# Patient Record
Sex: Female | Born: 1945 | Race: White | Hispanic: No | Marital: Married | Smoking: Never smoker
Health system: Southern US, Community
[De-identification: ages and names within clinical notes are randomized; demographics above are authoritative.]

## PROBLEM LIST (undated history)

## (undated) DIAGNOSIS — E079 Disorder of thyroid, unspecified: Secondary | ICD-10-CM

## (undated) HISTORY — PX: DILATION AND CURETTAGE OF UTERUS: SHX78

## (undated) HISTORY — PX: COLONOSCOPY: SHX174

---

## 2006-07-17 ENCOUNTER — Emergency Department (HOSPITAL_COMMUNITY): Admission: EM | Admit: 2006-07-17 | Discharge: 2006-07-17 | Payer: Self-pay | Admitting: Emergency Medicine

## 2021-08-02 ENCOUNTER — Other Ambulatory Visit: Payer: Self-pay

## 2021-08-02 ENCOUNTER — Emergency Department (HOSPITAL_COMMUNITY): Payer: Medicare Other

## 2021-08-02 ENCOUNTER — Encounter (HOSPITAL_COMMUNITY): Payer: Self-pay

## 2021-08-02 ENCOUNTER — Emergency Department (HOSPITAL_COMMUNITY)
Admission: EM | Admit: 2021-08-02 | Discharge: 2021-08-03 | Disposition: A | Discharge status: Home / Self Care | Payer: Medicare Other | Attending: Emergency Medicine | Admitting: Emergency Medicine

## 2021-08-02 ENCOUNTER — Ambulatory Visit
Admission: EM | Admit: 2021-08-02 | Discharge: 2021-08-02 | Disposition: A | Discharge status: Home / Self Care | Payer: Medicare Other

## 2021-08-02 DIAGNOSIS — R072 Precordial pain: Secondary | ICD-10-CM | POA: Diagnosis not present

## 2021-08-02 HISTORY — DX: Disorder of thyroid, unspecified: E07.9

## 2021-08-02 LAB — COMPREHENSIVE METABOLIC PANEL
ALT: 24 U/L (ref 0–44)
AST: 25 U/L (ref 15–41)
Albumin: 3.9 g/dL (ref 3.5–5.0)
Alkaline Phosphatase: 88 U/L (ref 38–126)
Anion gap: 5 (ref 5–15)
BUN: 18 mg/dL (ref 8–23)
CO2: 27 mmol/L (ref 22–32)
Calcium: 9.3 mg/dL (ref 8.9–10.3)
Chloride: 109 mmol/L (ref 98–111)
Creatinine, Ser: 0.96 mg/dL (ref 0.44–1.00)
GFR, Estimated: >60 mL/min (ref >60)
Glucose, Bld: 101 mg/dL — ABNORMAL HIGH (ref 70–99)
Potassium: 3.6 mmol/L (ref 3.5–5.1)
Sodium: 141 mmol/L (ref 135–145)
Total Bilirubin: 0.9 mg/dL (ref 0.3–1.2)
Total Protein: 7.2 g/dL (ref 6.5–8.1)

## 2021-08-02 LAB — CBC WITH DIFFERENTIAL/PLATELET
Abs Immature Granulocytes: 0.02 10*3/uL (ref 0.00–0.07)
Basophils Absolute: 0 10*3/uL (ref 0.0–0.1)
Basophils Relative: 0 %
Eosinophils Absolute: 0.2 10*3/uL (ref 0.0–0.5)
Eosinophils Relative: 4 %
HCT: 36 % (ref 36.0–46.0)
Hemoglobin: 12.2 g/dL (ref 12.0–15.0)
Immature Granulocytes: 0 %
Lymphocytes Relative: 37 %
Lymphs Abs: 2.3 10*3/uL (ref 0.7–4.0)
MCH: 32.8 pg (ref 26.0–34.0)
MCHC: 33.9 g/dL (ref 30.0–36.0)
MCV: 96.8 fL (ref 80.0–100.0)
Monocytes Absolute: 0.8 10*3/uL (ref 0.1–1.0)
Monocytes Relative: 13 %
Neutro Abs: 2.8 10*3/uL (ref 1.7–7.7)
Neutrophils Relative %: 46 %
Platelets: 263 10*3/uL (ref 150–400)
RBC: 3.72 MIL/uL — ABNORMAL LOW (ref 3.87–5.11)
RDW: 13.2 % (ref 11.5–15.5)
WBC: 6.1 10*3/uL (ref 4.0–10.5)
nRBC: 0 % (ref 0.0–0.2)

## 2021-08-02 LAB — TROPONIN I (HIGH SENSITIVITY)
Troponin I (High Sensitivity): 6 ng/L (ref <18)
Troponin I (High Sensitivity): 7 ng/L (ref <18)

## 2021-08-02 MED ORDER — PANTOPRAZOLE SODIUM 40 MG IV SOLR
40.0000 mg | Freq: Once | INTRAVENOUS | Status: AC
  Administered 2021-08-02: 40 mg via INTRAVENOUS
  Filled 2021-08-02: qty 40

## 2021-08-02 NOTE — ED Triage Notes (Signed)
Pt presents to ED with mid chest pain radiating into left shoulder started 3 days ago, pt states it started with indigestion. Pt describes as a "steady hurt"

## 2021-08-02 NOTE — ED Notes (Signed)
Patient is being discharged from the Urgent Care and sent to the Emergency Department via pov . Per [place] PA , patient is in need of higher level of care due to abd pain. Patient is aware and verbalizes understanding of plan of care. There were no vitals filed for this visit.

## 2021-08-02 NOTE — ED Provider Notes (Addendum)
[facility] EMERGENCY DEPARTMENT Provider Note   CSN: [id number] Arrival date & time: 08/02/21  1757     History Chief Complaint  Patient presents with   Chest Pain    Rebecca Dodson is a 75 y.o. female.  Patient visiting from [place].  Patient with complaint of intermittent substernal chest pain for 3 days.  At times radiates to the left arm.  The pain has never lasted 15 minutes or longer.  Usually just seconds.  No nausea vomiting no shortness of breath.  Oxygen saturations here are 100% on room air.  No prior history of coronary artery disease.  No prior history of gastroesophageal reflux disease.  Patient has been trying over-the-counter Prilosec without any improvement.      Past Medical History:  Diagnosis Date   Thyroid disease     There are no problems to display for this patient.   Past Surgical History:  Procedure Laterality Date   COLONOSCOPY     DILATION AND CURETTAGE OF UTERUS       OB History   No obstetric history on file.     No family history on file.  Social History   Tobacco Use   Smoking status: Never   Smokeless tobacco: Never  Substance Use Topics   Alcohol use: Never   Drug use: Never    Home Medications Prior to Admission medications   Not on File    Allergies    Patient has no known allergies.  Review of Systems   Review of Systems  Constitutional:  Negative for chills and fever.  HENT:  Negative for ear pain and sore throat.   Eyes:  Negative for pain and visual disturbance.  Respiratory:  Negative for cough and shortness of breath.   Cardiovascular:  Positive for chest pain. Negative for palpitations.  Gastrointestinal:  Negative for abdominal pain and vomiting.  Genitourinary:  Negative for dysuria and hematuria.  Musculoskeletal:  Negative for arthralgias and back pain.  Skin:  Negative for color change and rash.  Neurological:  Negative for seizures and syncope.  All other systems reviewed and are  negative.  Physical Exam Updated Vital Signs BP 138/63   Pulse 87   Temp 97.6 F (36.4 C) (Oral)   Resp 15   Ht 1.638 m (5' 4.5")   Wt 65.5 kg   SpO2 100%   BMI 24.40 kg/m   Physical Exam Vitals and nursing note reviewed.  Constitutional:      General: She is not in acute distress.    Appearance: Normal appearance. She is well-developed.  HENT:     Head: Normocephalic and atraumatic.  Eyes:     Extraocular Movements: Extraocular movements intact.     Conjunctiva/sclera: Conjunctivae normal.     Pupils: Pupils are equal, round, and reactive to light.  Cardiovascular:     Rate and Rhythm: Normal rate and regular rhythm.     Heart sounds: No murmur heard. Pulmonary:     Effort: Pulmonary effort is normal. No respiratory distress.     Breath sounds: Normal breath sounds.  Chest:     Chest wall: No tenderness.  Abdominal:     General: There is no distension.     Palpations: Abdomen is soft.     Tenderness: There is no abdominal tenderness.  Musculoskeletal:        General: No swelling. Normal range of motion.     Cervical back: Normal range of motion and neck supple.  Skin:  General: Skin is warm and dry.  Neurological:     General: No focal deficit present.     Mental Status: She is alert and oriented to person, place, and time.     Cranial Nerves: No cranial nerve deficit.     Sensory: No sensory deficit.     Motor: No weakness.    ED Results / Procedures / Treatments   Labs (all labs ordered are listed, but only abnormal results are displayed) Labs Reviewed  COMPREHENSIVE METABOLIC PANEL - Abnormal; Notable for the following components:      Result Value   Glucose, Bld 101 (*)    All other components within normal limits  CBC WITH DIFFERENTIAL/PLATELET - Abnormal; Notable for the following components:   RBC 3.72 (*)    All other components within normal limits  TROPONIN I (HIGH SENSITIVITY)    EKG EKG Interpretation  Date/Time:  Friday August 02 2021 18:04:29 EDT Ventricular Rate:  96 PR Interval:  154 QRS Duration: 84 QT Interval:  363 QTC Calculation: 459 R Axis:   78 Text Interpretation: Sinus rhythm No previous ECGs available Confirmed by Vanetta Mulders [phone]) on 08/02/2021 6:43:58 PM  Radiology DG Chest Port 1 View  Result Date: 08/02/2021 CLINICAL DATA:  Chest pain. Epigastric pain radiating to the left shoulder for 3 days. EXAM: PORTABLE CHEST 1 VIEW COMPARISON:  None. FINDINGS: Heart size and pulmonary vascularity are normal. Peribronchial thickening with diffuse interstitial pattern to the lungs likely representing chronic bronchitis. Emphysematous changes in the lungs. Scarring in the lung apices. No focal consolidation. No pleural effusions. No pneumothorax. Mediastinal contours appear intact. Calcification of the aorta. 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 No evidence of active pulmonary disease. Electronically Signed   By: Burman Nieves M.D.   On: 08/02/2021 20:10    Procedures Procedures   Medications Ordered in ED Medications  pantoprazole (PROTONIX) injection 40 mg (has no administration in time range)    ED Course  I have reviewed the triage vital signs and the nursing notes.  Pertinent labs & imaging results that were available during my care of the patient were reviewed by me and considered in my medical decision making (see chart for details).    MDM Rules/Calculators/A&P                           Patient's chest pains been very intermittent is also been going on for 3 days.  First troponin should be very telling.  EKG without any acute findings.  No leukocytosis no anemia.  Chest x-ray with some evidence of emphysema and chronic bronchitic changes.  But no active or acute disease.  Give a trial of some IV Protonix here.  While awaiting for the troponin.  Delta troponins without any significant change.  No evidence of an acute cardiac event.  Chest x-ray without  any acute findings.  Will treat with a trial of Protonix since patient is out of town have her follow-up with her doctors when she gets home. Final Clinical Impression(s) / ED Diagnoses Final diagnoses:  Precordial pain    Rx / DC Orders ED Discharge Orders     None        Vanetta Mulders, MD 08/02/21 2055    Vanetta Mulders, MD 08/03/21 [phone]

## 2021-08-02 NOTE — ED Notes (Signed)
Patient's family member at desk stating that patient needed to go to restroom.

## 2021-08-02 NOTE — ED Triage Notes (Signed)
Epigastric pain that radiates to LT shoulder x 3 days.  Pt reports she has been eating a lot of fried foods within the last week.

## 2021-08-03 MED ORDER — PANTOPRAZOLE SODIUM 40 MG PO TBEC: 40.0000 mg | DELAYED_RELEASE_TABLET | Freq: Every day | ORAL | 1 refills | Status: AC

## 2021-08-03 NOTE — Discharge Instructions (Addendum)
Work-up for the chest pain without any acute cardiac findings.  No significant pulmonary findings.  May be peptic ulcer or reflux related.  So take the Protonix as directed for the next 14 days.  Return for any new or worse symptoms.  When you get home follow-up with your doctors.

## 2022-06-26 IMAGING — DX DG CHEST 1V PORT
1 series · 1 of 1 positions shown · non-contrast
Comparison: None.

CLINICAL DATA: Chest pain. Epigastric pain radiating to the left
shoulder for 3 days.

EXAM:
PORTABLE CHEST 1 VIEW

[chest ap]
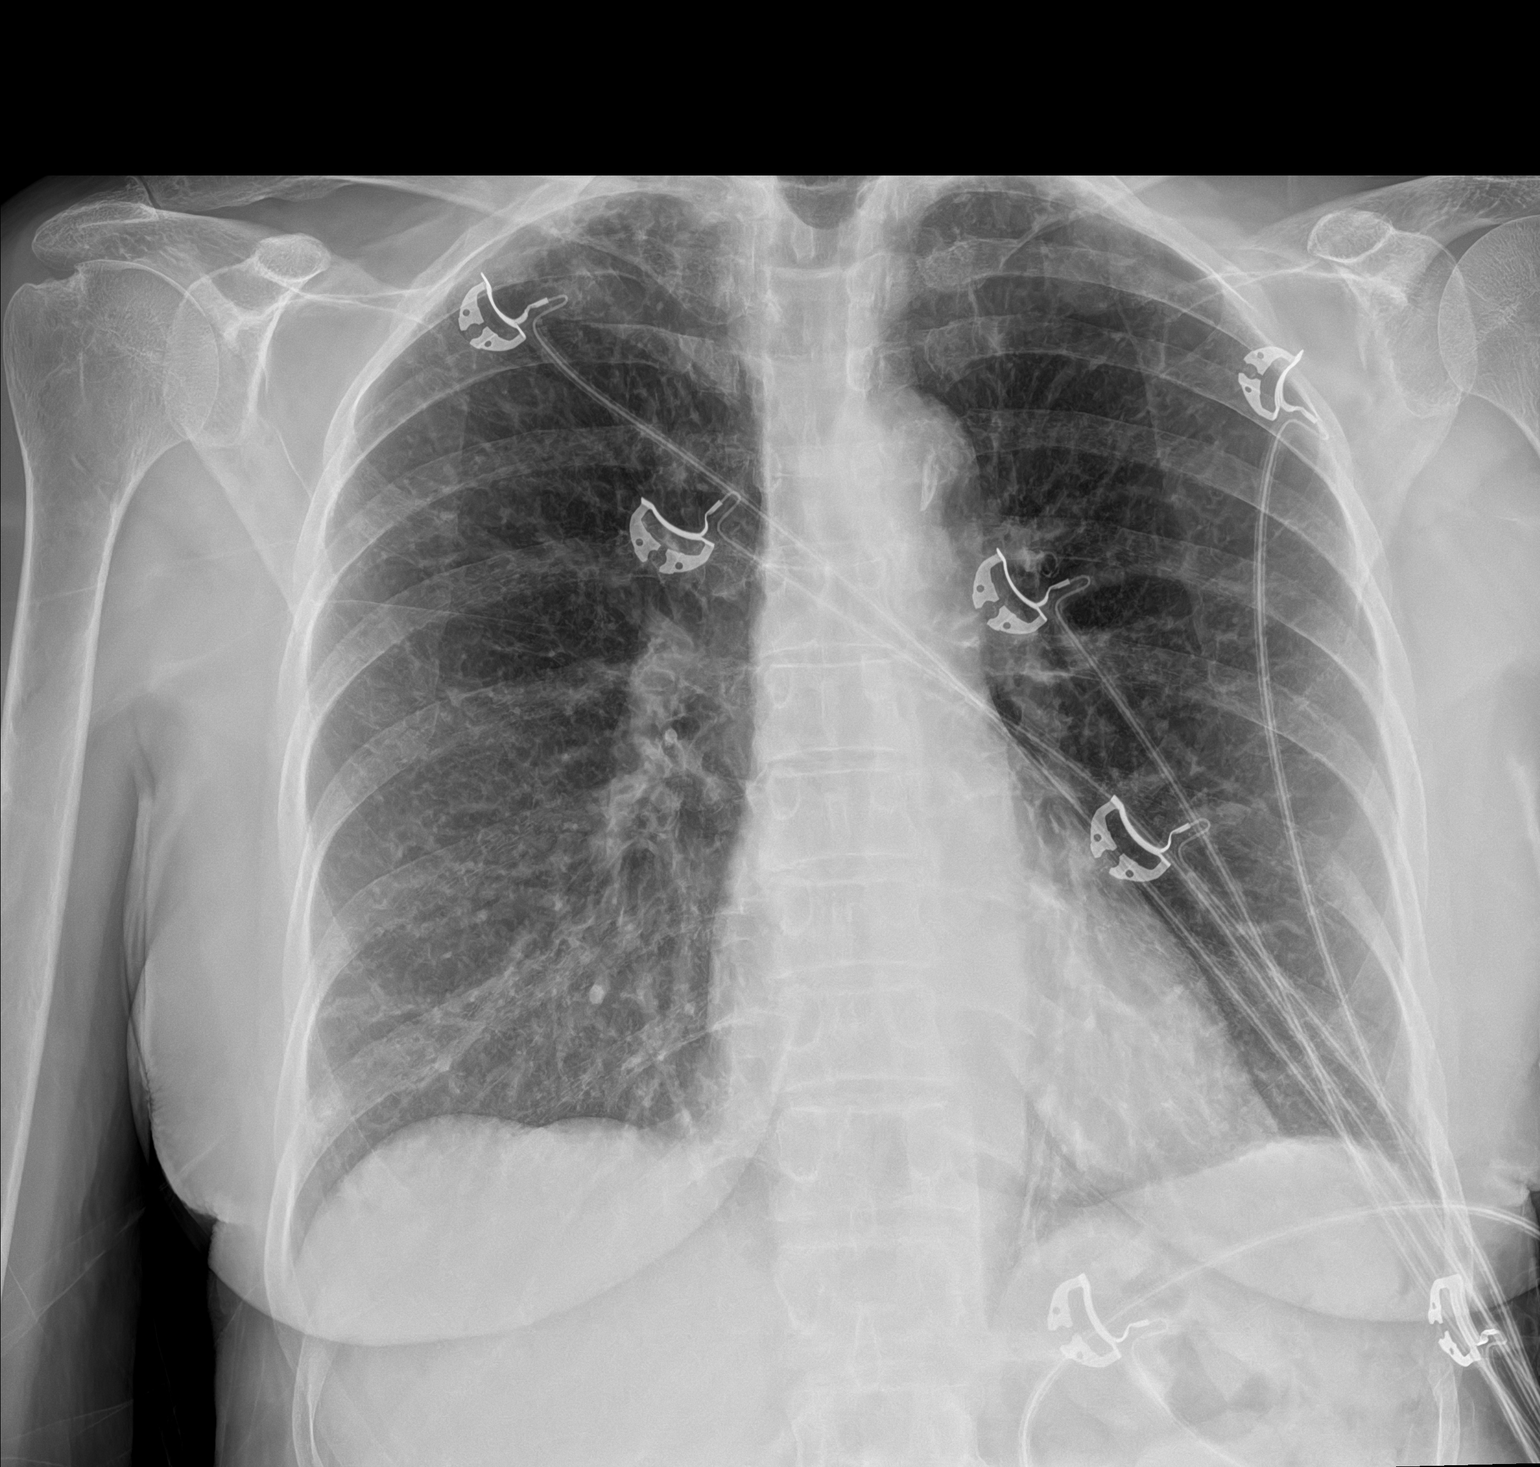

[1 of 1 positions shown; findings below may reference images not displayed]

FINDINGS: Heart size and pulmonary vascularity are normal. Peribronchial
thickening with diffuse interstitial pattern to the lungs likely
representing chronic bronchitis. Emphysematous changes in the lungs.
Scarring in the lung apices. No focal consolidation. No pleural
effusions. No pneumothorax. Mediastinal contours appear intact.
Calcification of the aorta.
IMPRESSION: Emphysematous and chronic bronchitic changes in the lungs. No
evidence of active pulmonary disease.
